# Patient Record
Sex: Male | Born: 1965 | Race: Black or African American | Hispanic: No | Smoking: Never smoker
Health system: Southern US, Community
[De-identification: ages and names within clinical notes are randomized; demographics above are authoritative.]

## PROBLEM LIST (undated history)

## (undated) ENCOUNTER — Ambulatory Visit: Payer: Self-pay

## (undated) DIAGNOSIS — I10 Essential (primary) hypertension: Secondary | ICD-10-CM

## (undated) DIAGNOSIS — E119 Type 2 diabetes mellitus without complications: Secondary | ICD-10-CM

---

## 2005-11-09 ENCOUNTER — Ambulatory Visit: Payer: Self-pay | Admitting: Internal Medicine

## 2006-02-17 ENCOUNTER — Ambulatory Visit: Payer: Self-pay | Admitting: Internal Medicine

## 2006-02-21 ENCOUNTER — Ambulatory Visit: Payer: Self-pay | Admitting: Internal Medicine

## 2006-03-21 ENCOUNTER — Ambulatory Visit: Payer: Self-pay | Admitting: Internal Medicine

## 2006-06-04 ENCOUNTER — Ambulatory Visit: Payer: Self-pay | Admitting: Unknown Physician Specialty

## 2006-06-17 ENCOUNTER — Ambulatory Visit: Payer: Self-pay | Admitting: Unknown Physician Specialty

## 2007-04-22 ENCOUNTER — Ambulatory Visit: Payer: Self-pay | Admitting: Unknown Physician Specialty

## 2007-06-30 ENCOUNTER — Ambulatory Visit: Payer: Self-pay | Admitting: Internal Medicine

## 2007-10-27 ENCOUNTER — Ambulatory Visit: Payer: Self-pay | Admitting: Unknown Physician Specialty

## 2008-07-11 ENCOUNTER — Ambulatory Visit: Payer: Self-pay | Admitting: Unknown Physician Specialty

## 2012-05-11 ENCOUNTER — Ambulatory Visit: Payer: Self-pay | Admitting: Surgery

## 2012-05-11 LAB — POTASSIUM: Potassium: 3.4 mmol/L — ABNORMAL LOW (ref 3.5–5.1)

## 2012-05-15 ENCOUNTER — Ambulatory Visit: Payer: Self-pay | Admitting: Surgery

## 2013-09-06 ENCOUNTER — Ambulatory Visit: Payer: Self-pay | Admitting: Anesthesiology

## 2013-09-06 DIAGNOSIS — I10 Essential (primary) hypertension: Secondary | ICD-10-CM

## 2013-09-06 LAB — POTASSIUM: POTASSIUM: 3.7 mmol/L (ref 3.5–5.1)

## 2013-09-10 ENCOUNTER — Ambulatory Visit: Payer: Self-pay | Admitting: Surgery

## 2014-04-14 ENCOUNTER — Other Ambulatory Visit: Payer: Self-pay | Admitting: Family Medicine

## 2014-04-16 ENCOUNTER — Other Ambulatory Visit: Payer: Self-pay | Admitting: Family Medicine

## 2014-06-10 NOTE — Op Note (Signed)
PATIENT NAME:  Eric [name], Eric [name] MR#:  [id number] DATE OF BIRTH:  04-23-1965  DATE OF PROCEDURE:  05/15/2012  PREOPERATIVE DIAGNOSIS: Umbilical hernia.   POSTOPERATIVE DIAGNOSIS: Umbilical hernia.   PROCEDURE: Umbilical hernia repair.   SURGEON: Renda RollsWilton Smith, M.D.   ANESTHESIA: General.   INDICATIONS: This 49 year old male had [name] chief complaint of pain with bulging at the umbilicus. An umbilical hernia was demonstrated on physical exam. The bulge was approximately 4 cm in dimension, reducible and tender. Surgery was recommended for definitive treatment.   DESCRIPTION OF PROCEDURE: The patient was placed on the operating table in the supine position under general anesthesia. The abdomen was prepared with ChloraPrep and draped in [name] sterile manner. [name] supraumbilical transversely oriented curvilinear incision was made some 4 cm in length, carried down through subcutaneous tissues to encounter an umbilical hernia sac, which was dissected free from surrounding structures down into the fascial ring defect. The sac was separated from the fascial ring defect. The sac was suture ligated with 0 Surgilon and amputated. It did not need to be sent for pathology. The stump was reduced. The fascial ring defect was approximately 2 cm in dimension. An atrial mesh was selected and cut to create an oval shape of some 2.5 x 3 cm and placed into the properitoneal plane and oriented transversely and was sutured to the overlying fascia with 0 Surgilon through and through sutures. Also, the fascial ring defect was closed with [name] transversely oriented suture line of interrupted 0 Surgilon figure-of-eight sutures incorporating each suture into the mesh. The repair looked good. Hemostasis was intact. [name] pursestring suture was used to approximate subcutaneous tissues using 4-0 Monocryl and also attached this to the dermis of the skin of the umbilicus.   Next, the skin was closed with running 4-0 Monocryl subcuticular suture and  Dermabond. The patient tolerated surgery satisfactorily and was prepared for transfer to the recovery room.  ____________________________ Shela CommonsJ. Renda RollsWilton Smith, MD jws:aw D: 05/15/2012 10:55:57 ET T: 05/15/2012 11:23:03 ET JOB#: [id number]  cc: Adella HareJ. Wilton Smith, MD, <Dictator> Adella HareWILTON J SMITH MD ELECTRONICALLY SIGNED 05/20/2012 17:54

## 2014-06-11 NOTE — Op Note (Signed)
PATIENT NAME:  Eric [name], Eric [name] MR#:  [id number] DATE OF BIRTH:  [birth year]  DATE OF PROCEDURE:  09/10/2013  PREOPERATIVE DIAGNOSIS: Ventral hernia.   POSTOPERATIVE DIAGNOSIS: Ventral hernia.   PROCEDURE: Ventral hernia repair.   SURGEON: Eric RollsWilton Silva Aamodt, MD  ANESTHESIA: General.   INDICATIONS: This 49 year old male has had bulging in the epigastrium, right of the midline, and [name] ventral hernia was identified on physical exam. Repair was recommended for definitive treatment.   DESCRIPTION OF PROCEDURE: The patient was placed on the operating table in the supine position under general anesthesia. The abdomen was clipped and prepared with ChloraPrep solution and draped in [name] sterile manner.   The hernia was palpable. [name] transversely oriented incision was made, which the incision was oriented to the right of the midline. It was approximately 5 cm in length and was approximately 3 cm above the umbilicus. It was carried down through subcutaneous tissues to encounter [name] ventral hernia sac. The sac was dissected free from surrounding structures down into [name] fascial ring defect. The sac was dissected away from the fascial ring defect circumferentially. The sac itself was approximately 5 cm in length and it was inverted. Next, the properitoneal fat was dissected away from the fascial ring defect circumferentially. [name] Bard soft mesh was cut to create [name] circular shape of some 2.5 cm in diameter. This was placed into the properitoneal plane and sutured to the overlying fascia with through and through 0 Surgilon sutures. Next, the fascial ring defect was closed with [name] transversely oriented suture line of interrupted 0 Surgilon figure-of-eight sutures incorporating each suture into the mesh. The repair looked good. It is noted that during the course of the procedure [name] number of small bleeding points were cauterized. Hemostasis was subsequently intact. The fascia surrounding the repair was infiltrated with 0.5% Sensorcaine  with epinephrine. Subcutaneous tissues were infiltrated as well. Next, [name] 4-0 Vicryl pursestring suture was placed to obliterate dead space. Next, the skin was closed with running 4-0 Monocryl subcuticular suture and Dermabond.   The patient appeared to tolerate the procedure satisfactorily and was prepared for transfer to the recovery room.  ____________________________ Eric CommonsJ. Eric RollsWilton Jedadiah Abdallah, MD jws:sb D: 09/10/2013 09:47:58 ET T: 09/10/2013 10:18:23 ET JOB#: [id number]  cc: Eric HareJ. Wilton Deran Barro, MD, <Dictator> Eric HareWILTON J Greyden Besecker MD ELECTRONICALLY SIGNED 09/10/2013 15:04

## 2015-06-14 ENCOUNTER — Encounter: Payer: BLUE CROSS/BLUE SHIELD | Attending: Internal Medicine | Admitting: Dietician

## 2016-07-01 ENCOUNTER — Encounter: Payer: Self-pay | Admitting: Emergency Medicine

## 2016-07-01 ENCOUNTER — Emergency Department
Admission: EM | Admit: 2016-07-01 | Discharge: 2016-07-01 | Disposition: A | Discharge status: Home / Self Care | Payer: No Typology Code available for payment source | Attending: Emergency Medicine | Admitting: Emergency Medicine

## 2016-07-01 ENCOUNTER — Emergency Department: Payer: No Typology Code available for payment source

## 2016-07-01 DIAGNOSIS — Y939 Activity, unspecified: Secondary | ICD-10-CM | POA: Diagnosis not present

## 2016-07-01 DIAGNOSIS — I10 Essential (primary) hypertension: Secondary | ICD-10-CM | POA: Insufficient documentation

## 2016-07-01 DIAGNOSIS — Z794 Long term (current) use of insulin: Secondary | ICD-10-CM | POA: Diagnosis not present

## 2016-07-01 DIAGNOSIS — Y92410 Unspecified street and highway as the place of occurrence of the external cause: Secondary | ICD-10-CM | POA: Insufficient documentation

## 2016-07-01 DIAGNOSIS — Y999 Unspecified external cause status: Secondary | ICD-10-CM | POA: Insufficient documentation

## 2016-07-01 DIAGNOSIS — S199XXA Unspecified injury of neck, initial encounter: Secondary | ICD-10-CM | POA: Diagnosis present

## 2016-07-01 DIAGNOSIS — M7918 Myalgia, other site: Secondary | ICD-10-CM

## 2016-07-01 DIAGNOSIS — S161XXA Strain of muscle, fascia and tendon at neck level, initial encounter: Secondary | ICD-10-CM | POA: Diagnosis not present

## 2016-07-01 DIAGNOSIS — E119 Type 2 diabetes mellitus without complications: Secondary | ICD-10-CM | POA: Diagnosis not present

## 2016-07-01 DIAGNOSIS — M791 Myalgia: Secondary | ICD-10-CM | POA: Diagnosis not present

## 2016-07-01 HISTORY — DX: Type 2 diabetes mellitus without complications: E11.9

## 2016-07-01 HISTORY — DX: Essential (primary) hypertension: I10

## 2016-07-01 MED ORDER — IBUPROFEN 600 MG PO TABS: 600.0000 mg | ORAL_TABLET | Freq: Three times a day (TID) | ORAL | 0 refills | Status: AC | PRN

## 2016-07-01 MED ORDER — IBUPROFEN 600 MG PO TABS
600.0000 mg | ORAL_TABLET | Freq: Once | ORAL | Status: AC
  Administered 2016-07-01: 600 mg via ORAL
  Filled 2016-07-01: qty 1

## 2016-07-01 MED ORDER — CYCLOBENZAPRINE HCL 10 MG PO TABS
10.0000 mg | ORAL_TABLET | Freq: Once | ORAL | Status: AC
  Administered 2016-07-01: 10 mg via ORAL
  Filled 2016-07-01: qty 1

## 2016-07-01 MED ORDER — TRAMADOL HCL 50 MG PO TABS
50.0000 mg | ORAL_TABLET | Freq: Once | ORAL | Status: AC
  Administered 2016-07-01: 50 mg via ORAL
  Filled 2016-07-01: qty 1

## 2016-07-01 MED ORDER — TRAMADOL HCL 50 MG PO TABS: 50.0000 mg | ORAL_TABLET | Freq: Four times a day (QID) | ORAL | 0 refills | Status: AC | PRN

## 2016-07-01 MED ORDER — CYCLOBENZAPRINE HCL 10 MG PO TABS: 10.0000 mg | ORAL_TABLET | Freq: Three times a day (TID) | ORAL | 0 refills | Status: AC | PRN
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
## 2018-10-30 ENCOUNTER — Other Ambulatory Visit: Payer: Self-pay | Admitting: *Deleted

## 2018-10-30 DIAGNOSIS — Z20822 Contact with and (suspected) exposure to covid-19: Secondary | ICD-10-CM

## 2018-11-01 LAB — NOVEL CORONAVIRUS, NAA: SARS-CoV-2, NAA: NOT DETECTED

## 2019-05-16 ENCOUNTER — Ambulatory Visit: Payer: Self-pay | Attending: Internal Medicine

## 2019-05-16 DIAGNOSIS — Z23 Encounter for immunization: Secondary | ICD-10-CM

## 2019-05-16 NOTE — Progress Notes (Signed)
   Covid-19 Vaccination Clinic  Name:  Eric [name]    MRN: [id number] DOB: [birth year]  05/16/2019  Eric [name] was observed post Covid-19 immunization for 15 minutes without incident. He was provided with Vaccine Information Sheet and instruction to access the V-Safe system.   Eric [name] was instructed to call 911 with any severe reactions post vaccine: Marland Kitchen Difficulty breathing  . Swelling of face and throat  . A fast heartbeat  . A bad rash all over body  . Dizziness and weakness   Immunizations Administered    Name Date Dose VIS Date Route   Pfizer COVID-19 Vaccine 05/16/2019 12:32 PM 0.3 mL 01/29/2019 Intramuscular   Manufacturer: [employer], [employer]   Lot: [id number]   NDC: [address]-1

## 2019-06-08 ENCOUNTER — Ambulatory Visit: Payer: Self-pay | Attending: Internal Medicine

## 2019-06-08 DIAGNOSIS — Z23 Encounter for immunization: Secondary | ICD-10-CM

## 2019-06-08 NOTE — Progress Notes (Signed)
   Covid-19 Vaccination Clinic  Name:  LYSLE YERO    MRN: [id number] DOB: 27-May-1965  06/08/2019  Mr. Vandergriff was observed post Covid-19 immunization for 15 minutes without incident. He was provided with Vaccine Information Sheet and instruction to access the V-Safe system.   Mr. Duthie was instructed to call 911 with any severe reactions post vaccine: Marland Kitchen Difficulty breathing  . Swelling of face and throat  . A fast heartbeat  . A bad rash all over body  . Dizziness and weakness   Immunizations Administered    Name Date Dose VIS Date Route   Pfizer COVID-19 Vaccine 06/08/2019  3:59 PM 0.3 mL 04/14/2018 Intramuscular   Manufacturer: [employer], [employer]   Lot: [id number]   NDC: [address]-7

## 2019-11-16 ENCOUNTER — Other Ambulatory Visit: Payer: Self-pay

## 2021-08-16 ENCOUNTER — Ambulatory Visit: Payer: Self-pay | Admitting: *Deleted

## 2021-12-03 ENCOUNTER — Ambulatory Visit: Payer: Self-pay | Admitting: Dermatology

## 2022-02-25 ENCOUNTER — Ambulatory Visit: Payer: Self-pay | Admitting: Dermatology

## 2023-06-05 ENCOUNTER — Other Ambulatory Visit: Payer: Self-pay

## 2023-06-05 MED ORDER — OLMESARTAN-AMLODIPINE-HCTZ 40-10-25 MG PO TABS
1.0000 | ORAL_TABLET | Freq: Every day | ORAL | 1 refills | Status: DC
  Filled 2023-06-05 (×2): qty 90, 90d supply, fill #0
  Filled 2023-08-09 – 2023-08-25 (×2): qty 90, 90d supply, fill #1

## 2023-06-05 MED ORDER — ATORVASTATIN CALCIUM 80 MG PO TABS
80.0000 mg | ORAL_TABLET | Freq: Every day | ORAL | 3 refills | Status: AC
  Filled 2023-10-05 – 2023-11-27 (×2): qty 90, 90d supply, fill #0
  Filled 2024-02-06: qty 90, 90d supply, fill #1

## 2023-06-05 MED ORDER — ATORVASTATIN CALCIUM 80 MG PO TABS
80.0000 mg | ORAL_TABLET | Freq: Every day | ORAL | 2 refills | Status: AC
  Filled 2023-06-05: qty 90, 90d supply, fill #0
  Filled 2023-08-29: qty 90, 90d supply, fill #1

## 2023-06-05 MED ORDER — LANTUS SOLOSTAR 100 UNIT/ML ~~LOC~~ SOPN
66.0000 [IU] | PEN_INJECTOR | Freq: Every day | SUBCUTANEOUS | 5 refills | Status: AC
  Filled 2023-06-28 – 2023-07-21 (×6): qty 60, 90d supply, fill #0
  Filled 2023-10-15: qty 60, 90d supply, fill #1
  Filled 2024-01-26 – 2024-03-15 (×7): qty 60, 90d supply, fill #2

## 2023-06-05 MED ORDER — INSULIN GLARGINE-YFGN 100 UNIT/ML ~~LOC~~ SOPN
66.0000 [IU] | PEN_INJECTOR | Freq: Every day | SUBCUTANEOUS | 5 refills | Status: AC
  Filled 2023-06-10: qty 60, 90d supply, fill #0
  Filled 2023-08-13: qty 45, 68d supply, fill #0
  Filled 2023-08-16: qty 60, 90d supply, fill #0

## 2023-06-05 MED ORDER — EMPAGLIFLOZIN 25 MG PO TABS
25.0000 mg | ORAL_TABLET | Freq: Every day | ORAL | 3 refills | Status: AC
  Filled 2023-06-05 – 2023-08-09 (×7): qty 90, 90d supply, fill #0

## 2023-06-05 MED ORDER — COLCHICINE 0.6 MG PO TABS
ORAL_TABLET | ORAL | 1 refills | Status: AC
  Filled 2023-06-28: qty 12, 10d supply, fill #0

## 2023-06-05 MED ORDER — LANSOPRAZOLE 30 MG PO CPDR
30.0000 mg | DELAYED_RELEASE_CAPSULE | Freq: Two times a day (BID) | ORAL | 1 refills | Status: DC
  Filled 2023-06-05 – 2023-08-09 (×2): qty 180, 90d supply, fill #0

## 2023-06-05 MED ORDER — OLMESARTAN-AMLODIPINE-HCTZ 40-10-25 MG PO TABS: 1.0000 | ORAL_TABLET | Freq: Every day | ORAL | 1 refills | Status: DC

## 2023-06-05 MED ORDER — CLOBETASOL PROPIONATE 0.05 % EX OINT
TOPICAL_OINTMENT | Freq: Every day | CUTANEOUS | 1 refills | Status: DC
  Filled 2023-07-29: qty 60, 30d supply, fill #0
  Filled 2023-08-21: qty 60, 30d supply, fill #1

## 2023-06-05 MED ORDER — CARVEDILOL 25 MG PO TABS
25.0000 mg | ORAL_TABLET | Freq: Two times a day (BID) | ORAL | 1 refills | Status: DC
  Filled 2023-06-05 (×2): qty 180, 90d supply, fill #0
  Filled 2023-08-29: qty 180, 90d supply, fill #1

## 2023-06-05 MED ORDER — LEVOCETIRIZINE DIHYDROCHLORIDE 5 MG PO TABS
5.0000 mg | ORAL_TABLET | Freq: Every evening | ORAL | 5 refills | Status: DC
  Filled 2023-06-05 – 2023-06-28 (×2): qty 30, 30d supply, fill #0
  Filled 2023-07-29: qty 30, 30d supply, fill #1
  Filled 2023-08-25: qty 30, 30d supply, fill #2
  Filled 2023-10-15: qty 30, 30d supply, fill #3
  Filled 2023-11-27: qty 30, 30d supply, fill #4

## 2023-06-05 MED ORDER — TADALAFIL 10 MG PO TABS
10.0000 mg | ORAL_TABLET | Freq: Every day | ORAL | 2 refills | Status: AC | PRN
  Filled 2023-06-05 – 2023-06-09 (×2): qty 30, 30d supply, fill #0
  Filled 2023-06-17: qty 10, 30d supply, fill #0
  Filled 2023-07-22: qty 10, 30d supply, fill #1
  Filled 2023-08-09: qty 10, 30d supply, fill #2
  Filled 2023-10-05: qty 10, 30d supply, fill #3
  Filled 2023-11-03: qty 10, 30d supply, fill #4
  Filled 2024-01-26: qty 10, 30d supply, fill #5

## 2023-06-05 MED ORDER — OZEMPIC (1 MG/DOSE) 4 MG/3ML ~~LOC~~ SOPN
1.0000 mg | PEN_INJECTOR | SUBCUTANEOUS | 11 refills | Status: DC
  Filled 2023-06-05 – 2023-07-22 (×9): qty 3, 28d supply, fill #0
  Filled 2023-08-16 – 2023-09-15 (×6): qty 3, 28d supply, fill #1
  Filled 2023-10-15: qty 3, 28d supply, fill #2
  Filled 2023-11-08: qty 3, 28d supply, fill #3

## 2023-06-05 MED ORDER — OZEMPIC (1 MG/DOSE) 4 MG/3ML ~~LOC~~ SOPN
1.0000 mg | PEN_INJECTOR | SUBCUTANEOUS | 3 refills | Status: AC
  Filled 2023-06-05: qty 3, 30d supply, fill #0
  Filled 2023-06-28: qty 3, 3d supply, fill #1
  Filled 2023-06-30: qty 3, 28d supply, fill #1
  Filled 2023-08-01 – 2023-08-21 (×6): qty 3, 28d supply, fill #2
  Filled 2023-10-05 – 2023-11-08 (×3): qty 3, 28d supply, fill #3
  Filled 2023-12-03: qty 3, 28d supply, fill #4
  Filled 2023-12-26 – 2024-01-04 (×2): qty 3, 28d supply, fill #5
  Filled 2024-01-26: qty 3, 28d supply, fill #6
  Filled 2024-02-22: qty 3, 28d supply, fill #7

## 2023-06-05 MED ORDER — DESONIDE 0.05 % EX CREA
TOPICAL_CREAM | Freq: Two times a day (BID) | CUTANEOUS | 3 refills | Status: AC
  Filled 2023-06-05: qty 60, 30d supply, fill #0
  Filled 2023-06-28: qty 60, 28d supply, fill #0
  Filled 2023-07-29: qty 60, 28d supply, fill #1
  Filled 2023-08-25: qty 60, 28d supply, fill #2

## 2023-06-05 MED ORDER — SPIRONOLACTONE 50 MG PO TABS
50.0000 mg | ORAL_TABLET | Freq: Every day | ORAL | 0 refills | Status: AC
  Filled 2023-06-05 – 2024-02-06 (×3): qty 30, 30d supply, fill #0

## 2023-06-05 MED ORDER — ACCU-CHEK GUIDE TEST VI STRP
ORAL_STRIP | Freq: Three times a day (TID) | 12 refills | Status: AC
  Filled 2023-06-10: qty 100, 30d supply, fill #0
  Filled 2023-07-10: qty 100, 30d supply, fill #1
  Filled 2023-08-09: qty 100, 30d supply, fill #2
  Filled 2023-09-08: qty 100, 30d supply, fill #3
  Filled 2023-10-05: qty 100, 30d supply, fill #4
  Filled 2023-11-08: qty 100, 30d supply, fill #5
  Filled 2023-12-10: qty 100, 30d supply, fill #6
  Filled 2024-01-26: qty 100, 30d supply, fill #7
  Filled 2024-02-22: qty 100, 30d supply, fill #8
  Filled 2024-03-15: qty 100, 30d supply, fill #9
  Filled 2024-03-24: qty 100, 30d supply, fill #10

## 2023-06-05 MED ORDER — SPIRONOLACTONE 50 MG PO TABS
50.0000 mg | ORAL_TABLET | Freq: Every day | ORAL | 1 refills | Status: AC
  Filled 2023-06-05 – 2023-08-09 (×2): qty 90, 90d supply, fill #0

## 2023-06-05 MED ORDER — EMPAGLIFLOZIN 25 MG PO TABS
25.0000 mg | ORAL_TABLET | Freq: Every day | ORAL | 3 refills | Status: AC
  Filled 2024-02-04: qty 90, 90d supply, fill #0

## 2023-06-09 ENCOUNTER — Other Ambulatory Visit: Payer: Self-pay

## 2023-06-10 ENCOUNTER — Other Ambulatory Visit: Payer: Self-pay

## 2023-06-17 ENCOUNTER — Other Ambulatory Visit: Payer: Self-pay

## 2023-06-19 ENCOUNTER — Other Ambulatory Visit: Payer: Self-pay

## 2023-06-29 ENCOUNTER — Other Ambulatory Visit: Payer: Self-pay

## 2023-06-30 ENCOUNTER — Other Ambulatory Visit: Payer: Self-pay

## 2023-07-01 ENCOUNTER — Other Ambulatory Visit: Payer: Self-pay

## 2023-07-04 ENCOUNTER — Other Ambulatory Visit: Payer: Self-pay

## 2023-07-07 ENCOUNTER — Other Ambulatory Visit: Payer: Self-pay

## 2023-07-08 ENCOUNTER — Other Ambulatory Visit: Payer: Self-pay

## 2023-07-10 ENCOUNTER — Other Ambulatory Visit: Payer: Self-pay

## 2023-07-11 ENCOUNTER — Other Ambulatory Visit: Payer: Self-pay

## 2023-07-15 ENCOUNTER — Other Ambulatory Visit: Payer: Self-pay

## 2023-07-16 ENCOUNTER — Other Ambulatory Visit: Payer: Self-pay

## 2023-07-17 ENCOUNTER — Other Ambulatory Visit: Payer: Self-pay

## 2023-07-18 ENCOUNTER — Other Ambulatory Visit: Payer: Self-pay

## 2023-07-21 ENCOUNTER — Other Ambulatory Visit: Payer: Self-pay

## 2023-07-22 ENCOUNTER — Other Ambulatory Visit: Payer: Self-pay

## 2023-07-22 MED ORDER — ALPRAZOLAM 0.5 MG PO TABS
0.5000 mg | ORAL_TABLET | Freq: Three times a day (TID) | ORAL | 0 refills | Status: DC | PRN
  Filled 2023-07-22: qty 90, 30d supply, fill #0

## 2023-07-23 ENCOUNTER — Other Ambulatory Visit: Payer: Self-pay

## 2023-07-23 MED ORDER — COLCHICINE 0.6 MG PO TABS
ORAL_TABLET | ORAL | 1 refills | Status: DC
  Filled 2023-07-23: qty 12, 4d supply, fill #0
  Filled 2023-08-01: qty 12, 4d supply, fill #1

## 2023-07-25 ENCOUNTER — Other Ambulatory Visit: Payer: Self-pay

## 2023-07-25 MED ORDER — LANTUS 100 UNIT/ML ~~LOC~~ SOLN
66.0000 [IU] | Freq: Every day | SUBCUTANEOUS | 5 refills | Status: DC
  Filled 2023-07-25 – 2023-08-01 (×2): qty 60, 90d supply, fill #0

## 2023-07-28 ENCOUNTER — Other Ambulatory Visit: Payer: Self-pay

## 2023-07-29 ENCOUNTER — Other Ambulatory Visit: Payer: Self-pay

## 2023-07-30 ENCOUNTER — Other Ambulatory Visit: Payer: Self-pay

## 2023-07-30 MED ORDER — PREDNISONE 10 MG PO TABS
ORAL_TABLET | ORAL | 0 refills | Status: DC
  Filled 2023-07-30: qty 20, 8d supply, fill #0

## 2023-07-31 ENCOUNTER — Other Ambulatory Visit: Payer: Self-pay

## 2023-08-01 ENCOUNTER — Other Ambulatory Visit: Payer: Self-pay

## 2023-08-02 ENCOUNTER — Other Ambulatory Visit: Payer: Self-pay

## 2023-08-02 ENCOUNTER — Other Ambulatory Visit (HOSPITAL_BASED_OUTPATIENT_CLINIC_OR_DEPARTMENT_OTHER): Payer: Self-pay

## 2023-08-04 ENCOUNTER — Other Ambulatory Visit: Payer: Self-pay

## 2023-08-05 ENCOUNTER — Other Ambulatory Visit: Payer: Self-pay

## 2023-08-05 MED ORDER — JARDIANCE 25 MG PO TABS
25.0000 mg | ORAL_TABLET | Freq: Every day | ORAL | 3 refills | Status: AC
  Filled 2023-08-05 – 2023-11-03 (×4): qty 90, 90d supply, fill #0

## 2023-08-06 ENCOUNTER — Other Ambulatory Visit: Payer: Self-pay

## 2023-08-06 MED ORDER — LANTUS SOLOSTAR 100 UNIT/ML ~~LOC~~ SOPN
66.0000 [IU] | PEN_INJECTOR | Freq: Every day | SUBCUTANEOUS | 3 refills | Status: AC
  Filled 2023-08-06: qty 60, 90d supply, fill #0
  Filled 2023-08-09 – 2023-10-05 (×9): qty 90, 136d supply, fill #0
  Filled 2023-10-15 – 2023-11-08 (×2): qty 60, 90d supply, fill #0
  Filled 2023-11-27 – 2023-12-03 (×2): qty 90, 136d supply, fill #0
  Filled 2023-12-10 – 2023-12-26 (×2): qty 60, 90d supply, fill #0
  Filled 2024-03-24: qty 60, 90d supply, fill #1

## 2023-08-06 MED ORDER — LANTUS SOLOSTAR 100 UNIT/ML ~~LOC~~ SOPN: 66.0000 [IU] | PEN_INJECTOR | Freq: Every day | SUBCUTANEOUS | 3 refills | Status: AC

## 2023-08-09 ENCOUNTER — Other Ambulatory Visit: Payer: Self-pay

## 2023-08-09 ENCOUNTER — Other Ambulatory Visit (HOSPITAL_BASED_OUTPATIENT_CLINIC_OR_DEPARTMENT_OTHER): Payer: Self-pay

## 2023-08-10 ENCOUNTER — Other Ambulatory Visit: Payer: Self-pay

## 2023-08-11 ENCOUNTER — Other Ambulatory Visit: Payer: Self-pay

## 2023-08-11 MED ORDER — COLCHICINE 0.6 MG PO TABS
ORAL_TABLET | ORAL | 1 refills | Status: DC
  Filled 2023-08-11: qty 12, 4d supply, fill #0
  Filled 2023-08-12: qty 12, 4d supply, fill #1

## 2023-08-12 ENCOUNTER — Other Ambulatory Visit: Payer: Self-pay

## 2023-08-13 ENCOUNTER — Other Ambulatory Visit: Payer: Self-pay

## 2023-08-13 MED ORDER — INSULIN PEN NEEDLE 32G X 4 MM MISC
1.0000 | Freq: Two times a day (BID) | 3 refills | Status: AC
  Filled 2023-08-13: qty 200, 90d supply, fill #0
  Filled 2023-11-08: qty 200, 90d supply, fill #1
  Filled 2024-01-26: qty 100, 50d supply, fill #2

## 2023-08-16 ENCOUNTER — Other Ambulatory Visit: Payer: Self-pay

## 2023-08-17 ENCOUNTER — Other Ambulatory Visit: Payer: Self-pay

## 2023-08-18 ENCOUNTER — Other Ambulatory Visit: Payer: Self-pay

## 2023-08-19 ENCOUNTER — Other Ambulatory Visit: Payer: Self-pay

## 2023-08-21 ENCOUNTER — Other Ambulatory Visit: Payer: Self-pay

## 2023-08-21 MED ORDER — COLCHICINE 0.6 MG PO TABS
ORAL_TABLET | ORAL | 1 refills | Status: DC
  Filled 2023-08-21: qty 12, 4d supply, fill #0
  Filled 2023-08-29: qty 12, 4d supply, fill #1

## 2023-08-23 ENCOUNTER — Other Ambulatory Visit: Payer: Self-pay

## 2023-08-24 ENCOUNTER — Other Ambulatory Visit: Payer: Self-pay

## 2023-08-25 ENCOUNTER — Other Ambulatory Visit: Payer: Self-pay

## 2023-08-25 MED ORDER — ALPRAZOLAM 0.5 MG PO TABS
0.5000 mg | ORAL_TABLET | Freq: Three times a day (TID) | ORAL | 0 refills | Status: DC | PRN
  Filled 2023-08-25: qty 90, 30d supply, fill #0

## 2023-08-27 ENCOUNTER — Other Ambulatory Visit: Payer: Self-pay

## 2023-08-29 ENCOUNTER — Other Ambulatory Visit: Payer: Self-pay

## 2023-09-01 ENCOUNTER — Other Ambulatory Visit: Payer: Self-pay

## 2023-09-08 ENCOUNTER — Other Ambulatory Visit: Payer: Self-pay

## 2023-09-09 ENCOUNTER — Other Ambulatory Visit: Payer: Self-pay

## 2023-09-09 MED ORDER — COLCHICINE 0.6 MG PO TABS
ORAL_TABLET | ORAL | 1 refills | Status: AC
  Filled 2023-09-09: qty 12, 10d supply, fill #0
  Filled 2023-10-15: qty 12, 12d supply, fill #1

## 2023-09-15 ENCOUNTER — Other Ambulatory Visit: Payer: Self-pay

## 2023-09-16 ENCOUNTER — Other Ambulatory Visit: Payer: Self-pay

## 2023-09-16 MED ORDER — DESONIDE 0.05 % EX CREA
1.0000 | TOPICAL_CREAM | Freq: Two times a day (BID) | CUTANEOUS | 3 refills | Status: AC
  Filled 2023-09-16: qty 60, 30d supply, fill #0
  Filled 2023-11-27: qty 60, 30d supply, fill #1
  Filled 2024-01-04 – 2024-02-04 (×2): qty 60, 30d supply, fill #2
  Filled 2024-03-15: qty 60, 30d supply, fill #3

## 2023-09-17 ENCOUNTER — Other Ambulatory Visit: Payer: Self-pay

## 2023-09-18 ENCOUNTER — Other Ambulatory Visit: Payer: Self-pay

## 2023-09-19 ENCOUNTER — Other Ambulatory Visit: Payer: Self-pay

## 2023-09-22 ENCOUNTER — Other Ambulatory Visit: Payer: Self-pay

## 2023-09-22 MED ORDER — CLOBETASOL PROPIONATE 0.05 % EX OINT
TOPICAL_OINTMENT | CUTANEOUS | 1 refills | Status: AC
  Filled 2023-09-22: qty 30, 30d supply, fill #0
  Filled 2023-11-04: qty 30, 30d supply, fill #1

## 2023-09-26 ENCOUNTER — Other Ambulatory Visit: Payer: Self-pay

## 2023-10-03 ENCOUNTER — Other Ambulatory Visit: Payer: Self-pay

## 2023-10-05 ENCOUNTER — Other Ambulatory Visit: Payer: Self-pay

## 2023-10-06 ENCOUNTER — Other Ambulatory Visit: Payer: Self-pay

## 2023-10-06 MED ORDER — ALPRAZOLAM 0.5 MG PO TABS
0.5000 mg | ORAL_TABLET | Freq: Three times a day (TID) | ORAL | 0 refills | Status: DC | PRN
  Filled 2023-10-06: qty 90, 30d supply, fill #0

## 2023-10-15 ENCOUNTER — Other Ambulatory Visit: Payer: Self-pay

## 2023-10-17 ENCOUNTER — Other Ambulatory Visit: Payer: Self-pay

## 2023-10-17 MED ORDER — COLCHICINE 0.6 MG PO TABS
ORAL_TABLET | ORAL | 1 refills | Status: AC
  Filled 2023-10-17: qty 12, 4d supply, fill #0
  Filled 2023-11-27: qty 12, 10d supply, fill #0
  Filled 2023-12-26: qty 12, 10d supply, fill #1

## 2023-10-27 ENCOUNTER — Other Ambulatory Visit: Payer: Self-pay

## 2023-11-03 ENCOUNTER — Other Ambulatory Visit: Payer: Self-pay

## 2023-11-04 ENCOUNTER — Other Ambulatory Visit: Payer: Self-pay

## 2023-11-04 MED ORDER — OLMESARTAN-AMLODIPINE-HCTZ 40-10-25 MG PO TABS
1.0000 | ORAL_TABLET | Freq: Every day | ORAL | 1 refills | Status: AC
  Filled 2023-11-04 (×3): qty 90, 90d supply, fill #0
  Filled 2024-03-24: qty 90, 90d supply, fill #1

## 2023-11-08 ENCOUNTER — Other Ambulatory Visit: Payer: Self-pay

## 2023-11-09 ENCOUNTER — Other Ambulatory Visit: Payer: Self-pay

## 2023-11-10 ENCOUNTER — Other Ambulatory Visit: Payer: Self-pay

## 2023-11-10 MED ORDER — LANSOPRAZOLE 30 MG PO CPDR
30.0000 mg | DELAYED_RELEASE_CAPSULE | Freq: Two times a day (BID) | ORAL | 1 refills | Status: DC
  Filled 2023-11-10: qty 180, 90d supply, fill #0
  Filled 2024-02-04: qty 180, 90d supply, fill #1

## 2023-11-21 ENCOUNTER — Other Ambulatory Visit: Payer: Self-pay

## 2023-11-21 MED ORDER — ALLOPURINOL 100 MG PO TABS
100.0000 mg | ORAL_TABLET | Freq: Every day | ORAL | 1 refills | Status: AC
  Filled 2023-11-21: qty 90, 90d supply, fill #0
  Filled 2024-02-06: qty 90, 90d supply, fill #1

## 2023-11-24 ENCOUNTER — Other Ambulatory Visit: Payer: Self-pay

## 2023-11-24 MED ORDER — TADALAFIL 10 MG PO TABS
10.0000 mg | ORAL_TABLET | Freq: Every day | ORAL | 2 refills | Status: AC | PRN
  Filled 2023-11-24: qty 10, 30d supply, fill #0
  Filled 2024-03-15 – 2024-03-16 (×2): qty 5, 5d supply, fill #1

## 2023-11-24 MED ORDER — CARVEDILOL 25 MG PO TABS
25.0000 mg | ORAL_TABLET | Freq: Two times a day (BID) | ORAL | 1 refills | Status: AC
  Filled 2023-11-24: qty 180, 90d supply, fill #0
  Filled 2024-02-18: qty 180, 90d supply, fill #1

## 2023-11-24 MED ORDER — SPIRONOLACTONE 50 MG PO TABS
50.0000 mg | ORAL_TABLET | Freq: Every day | ORAL | 1 refills | Status: AC
  Filled 2023-11-24: qty 90, 90d supply, fill #0
  Filled 2024-03-24: qty 90, 90d supply, fill #1

## 2023-11-25 ENCOUNTER — Other Ambulatory Visit: Payer: Self-pay

## 2023-11-27 ENCOUNTER — Other Ambulatory Visit: Payer: Self-pay

## 2023-11-28 ENCOUNTER — Other Ambulatory Visit: Payer: Self-pay

## 2023-11-28 MED ORDER — COLCHICINE 0.6 MG PO TABS
ORAL_TABLET | ORAL | 3 refills | Status: AC
  Filled 2023-11-28: qty 12, 4d supply, fill #0

## 2023-12-03 ENCOUNTER — Other Ambulatory Visit: Payer: Self-pay

## 2023-12-03 MED ORDER — ALPRAZOLAM 0.5 MG PO TABS
0.5000 mg | ORAL_TABLET | Freq: Three times a day (TID) | ORAL | 0 refills | Status: DC | PRN
  Filled 2023-12-03: qty 90, 30d supply, fill #0

## 2023-12-04 ENCOUNTER — Other Ambulatory Visit: Payer: Self-pay

## 2023-12-10 ENCOUNTER — Other Ambulatory Visit: Payer: Self-pay

## 2023-12-10 MED ORDER — OLMESARTAN-AMLODIPINE-HCTZ 40-10-25 MG PO TABS: 1.0000 | ORAL_TABLET | Freq: Every day | ORAL | 1 refills | Status: AC

## 2023-12-26 ENCOUNTER — Other Ambulatory Visit: Payer: Self-pay

## 2023-12-27 ENCOUNTER — Other Ambulatory Visit: Payer: Self-pay

## 2023-12-27 MED ORDER — LEVOCETIRIZINE DIHYDROCHLORIDE 5 MG PO TABS
5.0000 mg | ORAL_TABLET | Freq: Every evening | ORAL | 5 refills | Status: AC
  Filled 2023-12-27: qty 30, 30d supply, fill #0

## 2023-12-30 ENCOUNTER — Other Ambulatory Visit: Payer: Self-pay

## 2024-01-04 ENCOUNTER — Other Ambulatory Visit: Payer: Self-pay

## 2024-01-05 ENCOUNTER — Other Ambulatory Visit: Payer: Self-pay

## 2024-01-19 ENCOUNTER — Other Ambulatory Visit: Payer: Self-pay

## 2024-01-24 ENCOUNTER — Other Ambulatory Visit: Payer: Self-pay

## 2024-01-24 MED ORDER — PREDNISONE 10 MG PO TABS
ORAL_TABLET | ORAL | 0 refills | Status: AC
  Filled 2024-01-24: qty 21, 6d supply, fill #0

## 2024-01-26 ENCOUNTER — Other Ambulatory Visit: Payer: Self-pay

## 2024-01-26 MED ORDER — OLMESARTAN-AMLODIPINE-HCTZ 40-10-25 MG PO TABS
1.0000 | ORAL_TABLET | Freq: Every day | ORAL | 1 refills | Status: AC
  Filled 2024-01-26: qty 90, 90d supply, fill #0
  Filled 2024-03-24: qty 90, 90d supply, fill #1

## 2024-01-27 ENCOUNTER — Other Ambulatory Visit: Payer: Self-pay

## 2024-01-27 MED ORDER — ALPRAZOLAM 0.5 MG PO TABS
0.5000 mg | ORAL_TABLET | Freq: Three times a day (TID) | ORAL | 0 refills | Status: DC | PRN
  Filled 2024-01-27: qty 90, 30d supply, fill #0

## 2024-02-04 ENCOUNTER — Other Ambulatory Visit: Payer: Self-pay

## 2024-02-06 ENCOUNTER — Other Ambulatory Visit: Payer: Self-pay

## 2024-02-09 ENCOUNTER — Other Ambulatory Visit: Payer: Self-pay

## 2024-02-09 MED ORDER — JARDIANCE 25 MG PO TABS
25.0000 mg | ORAL_TABLET | Freq: Every day | ORAL | 3 refills | Status: AC
  Filled 2024-02-09: qty 90, 90d supply, fill #0

## 2024-02-18 ENCOUNTER — Other Ambulatory Visit: Payer: Self-pay

## 2024-02-20 ENCOUNTER — Other Ambulatory Visit: Payer: Self-pay

## 2024-02-22 ENCOUNTER — Other Ambulatory Visit: Payer: Self-pay

## 2024-02-23 ENCOUNTER — Other Ambulatory Visit: Payer: Self-pay

## 2024-02-25 ENCOUNTER — Other Ambulatory Visit: Payer: Self-pay

## 2024-02-25 MED ORDER — OZEMPIC (1 MG/DOSE) 4 MG/3ML ~~LOC~~ SOPN
1.0000 mg | PEN_INJECTOR | SUBCUTANEOUS | 0 refills | Status: AC
  Filled 2024-02-25: qty 9, 84d supply, fill #0
  Filled 2024-03-15: qty 3, 28d supply, fill #0

## 2024-03-15 ENCOUNTER — Other Ambulatory Visit: Payer: Self-pay

## 2024-03-16 ENCOUNTER — Other Ambulatory Visit: Payer: Self-pay

## 2024-03-16 MED ORDER — ALPRAZOLAM 0.5 MG PO TABS
0.5000 mg | ORAL_TABLET | Freq: Three times a day (TID) | ORAL | 0 refills | Status: AC | PRN
  Filled 2024-03-16: qty 90, 30d supply, fill #0

## 2024-03-24 ENCOUNTER — Other Ambulatory Visit: Payer: Self-pay

## 2024-03-25 ENCOUNTER — Other Ambulatory Visit: Payer: Self-pay

## 2024-03-25 MED ORDER — LANSOPRAZOLE 30 MG PO CPDR
30.0000 mg | DELAYED_RELEASE_CAPSULE | Freq: Two times a day (BID) | ORAL | 1 refills | Status: AC
  Filled 2024-03-25: qty 180, 90d supply, fill #0
